# Patient Record
Sex: Female | Born: 1990 | Race: White | Hispanic: No | Marital: Married | Smoking: Former smoker
Health system: Southern US, Community
[De-identification: ages and names within clinical notes are randomized; demographics above are authoritative.]

## PROBLEM LIST (undated history)

## (undated) DIAGNOSIS — J45909 Unspecified asthma, uncomplicated: Secondary | ICD-10-CM

---
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
## 2013-11-13 ENCOUNTER — Emergency Department (HOSPITAL_COMMUNITY)
Admission: EM | Admit: 2013-11-13 | Discharge: 2013-11-13 | Disposition: A | Discharge status: Home / Self Care | Payer: Medicaid Other | Attending: Emergency Medicine | Admitting: Emergency Medicine

## 2013-11-13 ENCOUNTER — Encounter (HOSPITAL_COMMUNITY): Payer: Self-pay | Admitting: Emergency Medicine

## 2013-11-13 DIAGNOSIS — R42 Dizziness and giddiness: Secondary | ICD-10-CM | POA: Insufficient documentation

## 2013-11-13 DIAGNOSIS — H65192 Other acute nonsuppurative otitis media, left ear: Secondary | ICD-10-CM

## 2013-11-13 DIAGNOSIS — Z79899 Other long term (current) drug therapy: Secondary | ICD-10-CM | POA: Insufficient documentation

## 2013-11-13 DIAGNOSIS — J45909 Unspecified asthma, uncomplicated: Secondary | ICD-10-CM | POA: Insufficient documentation

## 2013-11-13 DIAGNOSIS — O9989 Other specified diseases and conditions complicating pregnancy, childbirth and the puerperium: Secondary | ICD-10-CM | POA: Insufficient documentation

## 2013-11-13 DIAGNOSIS — Z87891 Personal history of nicotine dependence: Secondary | ICD-10-CM | POA: Diagnosis not present

## 2013-11-13 DIAGNOSIS — H65199 Other acute nonsuppurative otitis media, unspecified ear: Secondary | ICD-10-CM | POA: Diagnosis not present

## 2013-11-13 HISTORY — DX: Unspecified asthma, uncomplicated: J45.909

## 2013-11-13 LAB — COMPREHENSIVE METABOLIC PANEL
ALBUMIN: 3.1 g/dL — AB (ref 3.5–5.2)
ALK PHOS: 93 U/L (ref 39–117)
ALT: 10 U/L (ref 0–35)
AST: 12 U/L (ref 0–37)
Anion gap: 13 (ref 5–15)
BUN: 6 mg/dL (ref 6–23)
CALCIUM: 8.9 mg/dL (ref 8.4–10.5)
CO2: 22 mEq/L (ref 19–32)
Chloride: 104 mEq/L (ref 96–112)
Creatinine, Ser: 0.54 mg/dL (ref 0.50–1.10)
GFR calc non Af Amer: >90 mL/min (ref >90)
GLUCOSE: 86 mg/dL (ref 70–99)
POTASSIUM: 3.8 meq/L (ref 3.7–5.3)
SODIUM: 139 meq/L (ref 137–147)
TOTAL PROTEIN: 6.8 g/dL (ref 6.0–8.3)
Total Bilirubin: 0.2 mg/dL — ABNORMAL LOW (ref 0.3–1.2)

## 2013-11-13 LAB — CBC WITH DIFFERENTIAL/PLATELET
BASOS ABS: 0 10*3/uL (ref 0.0–0.1)
Basophils Relative: 0 % (ref 0–1)
EOS ABS: 0.2 10*3/uL (ref 0.0–0.7)
EOS PCT: 2 % (ref 0–5)
HCT: 33.5 % — ABNORMAL LOW (ref 36.0–46.0)
Hemoglobin: 11.3 g/dL — ABNORMAL LOW (ref 12.0–15.0)
LYMPHS ABS: 2.3 10*3/uL (ref 0.7–4.0)
Lymphocytes Relative: 20 % (ref 12–46)
MCH: 26.3 pg (ref 26.0–34.0)
MCHC: 33.7 g/dL (ref 30.0–36.0)
MCV: 77.9 fL — ABNORMAL LOW (ref 78.0–100.0)
Monocytes Absolute: 0.7 10*3/uL (ref 0.1–1.0)
Monocytes Relative: 6 % (ref 3–12)
NEUTROS PCT: 72 % (ref 43–77)
Neutro Abs: 8.4 10*3/uL — ABNORMAL HIGH (ref 1.7–7.7)
PLATELETS: 206 10*3/uL (ref 150–400)
RBC: 4.3 MIL/uL (ref 3.87–5.11)
RDW: 14.9 % (ref 11.5–15.5)
WBC: 11.6 10*3/uL — AB (ref 4.0–10.5)

## 2013-11-13 MED ORDER — PROMETHAZINE HCL 25 MG/ML IJ SOLN
25.0000 mg | Freq: Once | INTRAMUSCULAR | Status: AC
  Administered 2013-11-13: 25 mg via INTRAVENOUS
  Filled 2013-11-13: qty 1

## 2013-11-13 MED ORDER — KETOROLAC TROMETHAMINE 30 MG/ML IJ SOLN
30.0000 mg | Freq: Once | INTRAMUSCULAR | Status: AC
  Administered 2013-11-13: 30 mg via INTRAVENOUS
  Filled 2013-11-13: qty 1

## 2013-11-13 MED ORDER — MORPHINE SULFATE 4 MG/ML IJ SOLN: 6.0000 mg | Freq: Once | INTRAMUSCULAR | Status: DC

## 2013-11-13 MED ORDER — AMOXICILLIN 500 MG PO CAPS: 500.0000 mg | ORAL_CAPSULE | Freq: Three times a day (TID) | ORAL | Status: AC

## 2013-11-13 MED ORDER — PROMETHAZINE HCL 25 MG/ML IJ SOLN
INTRAMUSCULAR | Status: AC
  Filled 2013-11-13: qty 1

## 2013-11-13 NOTE — ED Provider Notes (Signed)
CSN: [id number]     Arrival date & time 11/13/13  1736 History  This chart was scribed for Benny Lennert, MD by Leona Carry, ED Scribe. The patient was seen in [facility]. The patient's care was started at 7:32 PM.   Chief Complaint  Patient presents with  . Dizziness    Patient is a 23 y.o. female presenting with dizziness and headaches. The history is provided by the patient. No language interpreter was used.  Dizziness Quality:  Unable to specify Associated symptoms: headaches   Associated symptoms: no chest pain, no diarrhea and no vomiting   Headache Pain location:  Frontal Quality:  Unable to specify Onset quality:  Sudden Timing:  Constant Progression:  Unchanged Chronicity:  New Associated symptoms: congestion, dizziness and ear pain (left ear)   Associated symptoms: no abdominal pain, no back pain, no cough, no diarrhea, no fatigue, no seizures, no sinus pressure and no vomiting    HPI Comments: Kollyns Mickelson Rondeau is a 23 y.o. female who presents to the Emergency Department complaining of a sudden-onset severe headache. Patient reports that the pain is currently concentrated "behind her eyes." Patient is currently pregnant (six months along). She reports associated left ear pain, dizziness, and photophobia. She denies fever, chills, vomiting, or diarrhea.  Patient does not have a PCP.   Past Medical History  Diagnosis Date  . Asthma    History reviewed. No pertinent past surgical history. No family history on file. History  Substance Use Topics  . Smoking status: Former Games developer  . Smokeless tobacco: Not on file  . Alcohol Use: No   OB History   Grav Para Term Preterm Abortions TAB SAB Ect Mult Living   1              Review of Systems  Constitutional: Negative for appetite change and fatigue.  HENT: Positive for congestion and ear pain (left ear). Negative for ear discharge and sinus pressure.   Eyes: Negative for discharge.  Respiratory: Negative for  cough.   Cardiovascular: Negative for chest pain.  Gastrointestinal: Negative for vomiting, abdominal pain and diarrhea.  Genitourinary: Negative for frequency and hematuria.  Musculoskeletal: Negative for back pain.  Skin: Negative for rash.  Neurological: Positive for dizziness and headaches. Negative for seizures.  Psychiatric/Behavioral: Negative for hallucinations.      Allergies  Other  Home Medications   Prior to Admission medications   Medication Sig Start Date End Date Taking? Authorizing Provider  albuterol (PROVENTIL HFA;VENTOLIN HFA) 108 (90 BASE) MCG/ACT inhaler Inhale 2 puffs into the lungs every 6 (six) hours as needed for wheezing or shortness of breath.   Yes Historical Provider, MD  Prenatal Vit-Fe Fumarate-FA (MULTIVITAMIN-PRENATAL) 27-0.8 MG TABS tablet Take 1 tablet by mouth daily at 12 noon.   Yes Historical Provider, MD  pyridOXINE (VITAMIN B-6) 100 MG tablet Take 100 mg by mouth daily.   Yes Historical Provider, MD   There were no vitals taken for this visit. Physical Exam  Constitutional: She is oriented to person, place, and time. She appears well-developed.  HENT:  Head: Normocephalic.  Left TM swollen.  Eyes: Conjunctivae and EOM are normal. No scleral icterus.  Mild tenderness around left orbit.  Neck: Neck supple. No thyromegaly present.  Cardiovascular: Normal rate and regular rhythm.  Exam reveals no gallop and no friction rub.   No murmur heard. Pulmonary/Chest: No stridor. She has no wheezes. She has no rales. She exhibits no tenderness.  Abdominal: She exhibits no  distension. There is no tenderness. There is no rebound.  Musculoskeletal: Normal range of motion. She exhibits no edema.  Lymphadenopathy:    She has no cervical adenopathy.  Neurological: She is oriented to person, place, and time. She exhibits normal muscle tone. Coordination normal.  Skin: No rash noted. No erythema.  Psychiatric: She has a normal mood and affect. Her behavior  is normal.    ED Course  Procedures (including critical care time) DIAGNOSTIC STUDIES:   COORDINATION OF CARE: 7:36 PM-Discussed treatment plan which includes Toradol, Phenergan, and labs with pt at bedside and pt agreed to plan.     Labs Review Labs Reviewed - No data to display  Imaging Review No results found.   EKG Interpretation None      MDM   Final diagnoses:  None   Headache improved.  tx otitis with amox  The chart was scribed for me under my direct supervision.  I personally performed the history, physical, and medical decision making and all procedures in the evaluation of this patient.Benny Lennert.   Arionna Hoggard L Wendy Mikles, MD 11/13/13 [phone]

## 2013-11-13 NOTE — ED Notes (Signed)
Patient has sudden onset of dizziness followed by [facility] on L frontal.  Now has started on R frontal.  Denies n/v.  Photophobia present.  Applied ice pack over eyes.

## 2013-11-13 NOTE — ED Notes (Signed)
Pt initially refused tordadol injection unitl speaking with Dr Zammit. Informed Dr Estell HarpinZammiEstell Harpint of this. Dr. Estell HarpinZammit spoke with pt and family and ordered 6mg  of morphine instead of tordadol. Pt opted to take toradol after discussion. meds were given per order.

## 2013-11-13 NOTE — ED Notes (Signed)
Patient arrives via EMS. She is visiting from out of town and had sudden onset dizziness, left ear pain, and decreased hearing in left ear. Patient, per family on scene, had syncopal episode for a few seconds. C/o headache.

## 2013-11-13 NOTE — ED Notes (Addendum)
Patient is 5 months pregnant. LMP Feb. 1, 2015. EDD: Nov 8th, 2015. Denies abdominal pain, vaginal discharge. Patient [facility] [place].

## 2013-11-13 NOTE — Discharge Instructions (Signed)
Follow up this week with your md if not improving.

## 2014-03-07 ENCOUNTER — Encounter (HOSPITAL_COMMUNITY): Payer: Self-pay | Admitting: Emergency Medicine
